# Patient Record
Sex: Female | Born: 1950 | Race: White | Hispanic: No | Smoking: Never smoker
Health system: Southern US, Community
[De-identification: ages and names within clinical notes are randomized; demographics above are authoritative.]

## PROBLEM LIST (undated history)

## (undated) DIAGNOSIS — K219 Gastro-esophageal reflux disease without esophagitis: Secondary | ICD-10-CM

## (undated) DIAGNOSIS — G4700 Insomnia, unspecified: Secondary | ICD-10-CM

## (undated) DIAGNOSIS — R5383 Other fatigue: Secondary | ICD-10-CM

## (undated) DIAGNOSIS — M549 Dorsalgia, unspecified: Secondary | ICD-10-CM

## (undated) DIAGNOSIS — K838 Other specified diseases of biliary tract: Secondary | ICD-10-CM

## (undated) DIAGNOSIS — K828 Other specified diseases of gallbladder: Secondary | ICD-10-CM

## (undated) DIAGNOSIS — D0430 Carcinoma in situ of skin of unspecified part of face: Secondary | ICD-10-CM

## (undated) HISTORY — DX: Insomnia, unspecified: G47.00

## (undated) HISTORY — DX: Carcinoma in situ of skin of unspecified part of face: D04.30

## (undated) HISTORY — DX: Other specified diseases of gallbladder: K82.8

## (undated) HISTORY — DX: Gastro-esophageal reflux disease without esophagitis: K21.9

## (undated) HISTORY — DX: Dorsalgia, unspecified: M54.9

## (undated) HISTORY — PX: OTHER SURGICAL HISTORY: SHX169

## (undated) HISTORY — DX: Other fatigue: R53.83

## (undated) HISTORY — DX: Other specified diseases of biliary tract: K83.8

---

## 1999-01-28 HISTORY — PX: ABDOMINAL HYSTERECTOMY: SHX81

## 2008-07-07 ENCOUNTER — Ambulatory Visit (HOSPITAL_COMMUNITY): Admission: RE | Admit: 2008-07-07 | Discharge: 2008-07-07 | Payer: Self-pay | Admitting: Gastroenterology

## 2009-01-27 LAB — HM COLONOSCOPY

## 2009-10-27 LAB — HM MAMMOGRAPHY

## 2010-10-17 ENCOUNTER — Encounter: Payer: Self-pay | Admitting: Family Medicine

## 2010-10-17 ENCOUNTER — Ambulatory Visit (INDEPENDENT_AMBULATORY_CARE_PROVIDER_SITE_OTHER): Payer: BC Managed Care – PPO | Admitting: Family Medicine

## 2010-10-17 VITALS — BP 141/90 | HR 81 | Temp 97.7°F | Ht 59.0 in | Wt 126.8 lb

## 2010-10-17 DIAGNOSIS — K219 Gastro-esophageal reflux disease without esophagitis: Secondary | ICD-10-CM

## 2010-10-17 DIAGNOSIS — D0439 Carcinoma in situ of skin of other parts of face: Secondary | ICD-10-CM

## 2010-10-17 DIAGNOSIS — G8929 Other chronic pain: Secondary | ICD-10-CM

## 2010-10-17 DIAGNOSIS — G4700 Insomnia, unspecified: Secondary | ICD-10-CM

## 2010-10-17 DIAGNOSIS — D0430 Carcinoma in situ of skin of unspecified part of face: Secondary | ICD-10-CM

## 2010-10-17 DIAGNOSIS — K838 Other specified diseases of biliary tract: Secondary | ICD-10-CM

## 2010-10-17 DIAGNOSIS — R5383 Other fatigue: Secondary | ICD-10-CM

## 2010-10-17 DIAGNOSIS — M549 Dorsalgia, unspecified: Secondary | ICD-10-CM

## 2010-10-17 DIAGNOSIS — Z23 Encounter for immunization: Secondary | ICD-10-CM

## 2010-10-17 DIAGNOSIS — R5381 Other malaise: Secondary | ICD-10-CM

## 2010-10-17 DIAGNOSIS — M545 Low back pain, unspecified: Secondary | ICD-10-CM | POA: Insufficient documentation

## 2010-10-17 HISTORY — DX: Other specified diseases of biliary tract: K83.8

## 2010-10-17 HISTORY — DX: Carcinoma in situ of skin of unspecified part of face: D04.30

## 2010-10-17 MED ORDER — CYCLOBENZAPRINE HCL 5 MG PO TABS: ORAL_TABLET | ORAL | Status: DC

## 2010-10-17 MED ORDER — ESOMEPRAZOLE MAGNESIUM 40 MG PO CPDR: DELAYED_RELEASE_CAPSULE | ORAL | Status: DC

## 2010-10-17 MED ORDER — RANITIDINE HCL 300 MG PO TABS: ORAL_TABLET | ORAL | Status: DC

## 2010-10-17 NOTE — Assessment & Plan Note (Signed)
Following with GI so far her symptoms are controlled with diet and Nexium so no further intervention has been pursued

## 2010-10-17 NOTE — Assessment & Plan Note (Signed)
Biopsy done in [place] and translated by family, they have been offered reassurance by dermatologist that all looks good but they agree to set her up with [facility] for further surveillance

## 2010-10-17 NOTE — Patient Instructions (Addendum)
Preventative Care for Adults - Female Studies show that half of deaths in the [place] today result from unhealthy lifestyle practices. This includes ignoring preventive care suggestions. Preventive health guidelines for women include the following key practices:  A routine yearly physical is a good way to check with your primary caregiver about your health and preventive screening. It is a chance to share any concerns and updates on your health, and to receive a thorough all-over exam.   If you smoke cigarettes, find out from your caregiver how to quit. It can literally save your life, no matter how long you have been a tobacco user. If you do not use tobacco, never start.   Maintain a healthy diet and normal weight. Increased weight leads to problems with blood pressure and diabetes. Decrease saturated fat in your diet and increase regular exercise. Eat a variety of foods, including fruit, vegetables, animal or vegetable protein (meat, fish, chicken, and eggs, or beans, lentils, and tofu), and grains, such as rice. Get information about proper diet from your caregiver, if needed.   Aerobic exercise helps maintain good heart health. The CDC and the American College of Sports Medicine recommend 30 minutes of moderate-intensity exercise (a brisk walk that increases your heart rate and breathing) on most days of the week. Ongoing high blood pressure should be treated with medicines, if weight loss and exercise are not effective.   Avoid smoking, drinking too much alcohol (more than two drinks per day), and use of street drugs. Do not share needles with anyone. Ask for professional help if you need support or instructions about stopping the use of alcohol, cigarettes, or drugs.   Maintain normal blood lipids and cholesterol, by minimizing your intake of saturated fat. Eat a well rounded diet, with plenty of fruit and vegetables. The National Institutes of Health encourage women to eat 5-9 servings of  fruit and vegetables each day. Your caregiver can give instructions to help you keep your risk of heart disease or stroke low. High blood pressure causes heart disease and increases risk of stroke. Blood pressure should be checked every 1-2 years, from age 20 onward.   Blood tests for high cholesterol, which causes heart and vessel disease, should begin at age 20 and be repeated every 5 years, if test results are normal. (Repeat tests more often if results are high.)   Diabetes screening involves taking a blood sample to check your blood sugar level, after a fasting period. This is done once every 3 years, after age 45, if test results are normal.   Breast cancer screening is essential to preventive care for women. All women age 20 and older should perform a breast self-exam every month. At age 40 and older, women should have their caregiver complete a breast exam each year. Women at ages 40-50 should have a mammogram (x-ray film) of the breasts each year. Your caregiver can discuss when to start your yearly mammograms.   Cervical cancer screening includes taking a Pap smear (sample of cells examined under a microscope) from the cervix (end of the uterus). It also includes testing for HPV (Human Papilloma Virus, which can cause cervical cancer). Screening and a pelvic exam should begin at age 21, or 3 years after a woman becomes sexually active. Screening should occur every year, with a Pap smear but no HPV testing, up to age 30. After age 30, you should have a Pap smear every 3 years with HPV testing, if no HPV was found previously.     Colon cancer can be detected, and often prevented, long before it is life threatening. Most routine colon cancer screening begins at the age of 50. On a yearly basis, your caregiver may provide easy-to-use take-home tests to check for hidden blood in the stool. Use of a small camera at the end of a tube, to directly examine the colon (sigmoidoscopy or colonoscopy), can  detect the earliest forms of colon cancer and can be life saving. Talk to your caregiver about this at age 50, when routine screening begins. (Screening is repeated every 5 years, unless early forms of pre-cancerous polyps or small growths are found.)   Practice safe sex. Use condoms. Condoms are used for birth control and to reduce the spread of sexually transmitted infections (STIs). Unsafe sex is sexual activity without the use of safeguards, such as condoms and avoidance of high-risk acts, to reduce the chances of getting or spreading STIs. STIs include gonorrhea (the clap), chlamydia, syphilis, trichimonas, herpes, HPV (human papilloma virus) and HIV (human immunodeficiency virus), which causes AIDS. Herpes, HIV, and HPV are viral illnesses that have no cure. They can result in disability, cancer, and death.   HPV causes cancer of the cervix, and other infections that can be transmitted from person to person. There is a vaccine for HPV, and females should get immunized between the ages of 11 and 26. It requires a series of 3 shots.   Osteoporosis is a disease in which the bones lose minerals and strength as we age. This can result in serious bone fractures. Risk of osteoporosis can be identified using a bone density scan. Women ages 65 and over should discuss this with their caregivers, as should women after menopause who have other risk factors. Ask your caregiver whether you should be taking a calcium supplement and Vitamin D, to reduce the rate of osteoporosis.   Menopause can be associated with physical symptoms and risks. Hormone replacement therapy is available to decrease these. You should talk to your caregiver about whether starting or continuing to take hormones is right for you.   Use sunscreen with SPF (skin protection factor) of 15 or more. Apply sunscreen liberally and repeatedly throughout the day. Being outside in the sun, when your shadow is shorter than you are, means you are being  exposed to sun at greater intensity. Lighter skinned people are at a greater risk of skin cancer. Wear sunglasses, to protect your eyes from too much damaging sunlight (which can speed up cataract formation).   Once a month, do a whole body skin exam or review, using a mirror to look at your back. Notify your caregiver of changes in moles, especially if there are changes in shapes, colors, irregular border, a size larger than a pencil eraser, or new moles develop.   Keep carbon monoxide and smoke detectors in your home, and functioning, at all times. Change the batteries every 6 months, or use a model that plugs into the wall.   Stay up to date with your tetanus shots and other required immunizations. A booster for tetanus should be given every 10 years. Be sure to get your flu shot every year, since 5%-20% of the [place] population comes down with the flu. The composition of the flu vaccine changes each year, so being vaccinated once is not enough. Get your shot in the fall, before the flu season peaks. The table below lists important vaccines to get. Other vaccines to consider include for Hepatitis A virus (to prevent a form of   infection of the liver, by a virus acquired from food), Varicella Zoster (a virus that causes shingles), and Meninogoccal (against bacteria which cause a form of meningitis).   Brush your teeth twice a day with fluoride toothpaste, and floss once a day. Good oral hygiene prevents tooth decay and gum disease, which can be painful and can cause other health problems. Visit your dentist for a routine oral and dental check up and preventive care every 6-12 months.   The Body Mass Index or BMI is a way of measuring how much of your body is fat. Having a BMI above 27 increases the risk of heart disease, diabetes, hypertension, stroke and other problems related to obesity. Your caregiver can help determine your BMI, and can develop an exercise and dietary program to help you achieve or  maintain this measurement at a healthy level.   Wear seat belts whenever you are in a vehicle, whether as passenger or driver, and even for short drives of a few minutes.   If you bicycle, wear a helmet at all times.  Preventative Care for Adult Women  Preventative Services Ages 40-39 Ages 12-64 Ages 55 and over  Health risk assessment and lifestyle counseling.     Blood pressure check.** Every 1-2 years Every 1-2 years Every 1-2 years  Total cholesterol check including HDL.** Every 5 years beginning at age 48 Every 5 years beginning at age 19, or more often if risk is high Every 5 years through age 79, then optional  Breast self exam. Monthly in all women ages 49 and older Monthly Monthly  Clinical breast exam.** Every 3 years beginning at age 85 Every year Every year  Mammogram.**  Every year beginning at age 89, optional from age 25-49 (discuss with your caregiver). Every year until age 45, then optional  Pap Smear** and HPV Screening. Every year from ages 32 through 28 Every 3 years from ages 61 through 52, if HPV is negative Optional; talk with your caregiver  Flexible sigmoidoscopy** or colonoscopy.**   Every 5 years beginning at age 66 Every 5 years until age 42; then optional  FOBT (fecal occult blood test) of stool.  Every year beginning at age 63 Every year until 65; then optional  Skin self-exam. Monthly Monthly Monthly  Tetanus-diphtheria (Td) immunization. Every 10 years Every 10 years Every 10 years  Influenza immunization.** Every year Every year Every year  HPV immunization. Once between the ages of 42 and 35     Pneumococcal immunization.** Optional Optional Every 5 years  Hepatitis B immunization.** Series of 3 immunizations  (if not done previously, usually given at 0, 1 to 2, and 4 to 6 months)  Check with your caregiver, if vaccination not previously given Check with your caregiver, if vaccination not previously given  ** Family history and personal history of risk and  conditions may change your caregiver's recommendations.  Document Released: 03/11/2001 Document Re-Released: 04/09/2009 [facility] Patient Information 2011 [place], [place].   Megared fish oil caps, 1 daily by Schiff  Consider Zostavax shot to prevent shingles  Find out if she got the Td or Tdap shot, if she only got the Td then she needs to boost with a Tdap to prevent pertussis/whooping cough

## 2010-10-17 NOTE — Assessment & Plan Note (Signed)
Poor sleep, multifactorial but low back pain is contributing encouraged to consider Melatonin, is given some low dose Cyclobenzaprine for low back pain as well

## 2010-10-17 NOTE — Assessment & Plan Note (Signed)
Patient with long history of low back pain and scoliosis is following with Chiropractic and getting some relief, encouraged moist heat and gentle stretching and given Cyclobenzaprine to take prn

## 2010-10-17 NOTE — Progress Notes (Signed)
Suzanne Ruiz [id number] 1950/10/10 10/17/2010      Progress Note New Patient  Subjective  Chief Complaint  Chief Complaint  Patient presents with  . Establish Care    new patient    HPI  Patient is a 60 yo Caucasian female who has moved here from [place] to help her daughter care for her 3 young children. She does not speak any English so her daughter is here to translate with her 3 young children so the visit is very chaotic. She denies any acute complaints except for a bruise on her nose x 1 day and some low back pain. She has trouble with recurrent low back pain and some left lower extremity radicular symptoms at times. Scoliosis has been a problem for her over the years and she is presently having a flare in her low back pain. No incontinence. No CP/palp/SOB/fevers/congestion/GI or GU c/o. Reflux is well controlled on Nexium daily but daughter is concerned about osteoporosis.  Past Medical History  Diagnosis Date  . Back pain   . Gallbladder sludge   . Insomnia   . GERD (gastroesophageal reflux disease)   . Fatigue   . Biliary sludge 10/17/2010  . 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 Abdominal hysterectomy 2001    still has ovaries  . Skin biopsy on nose     cancerous    Family History  Problem Relation Age of Onset  . Cancer Mother     duodenal   . Other Brother     paralyzed on one side- but can walk and work  . Cancer Maternal Grandfather   . Obesity Maternal Grandfather     History   Social History  . Marital Status: Widowed    Spouse Name: N/A    Number of Children: N/A  . Years of Education: N/A   Occupational History  . Not on file.   Social History Main Topics  . Smoking status: Never Smoker   . Smokeless tobacco: Never Used  . Alcohol Use: No  . Drug Use: No  . Sexually Active: Not on file   Other Topics Concern  . Not on file   Social History Narrative   From [place]- been in [place] since  2009    No current outpatient prescriptions on file prior to visit.    Allergies  Allergen Reactions  . Mucinex     Sick to stomach    Review of Systems  Review of Systems  Constitutional: Positive for malaise/fatigue. Negative for fever and chills.  HENT: Negative for hearing loss, nosebleeds and congestion.   Eyes: Negative for discharge.  Respiratory: Negative for cough, sputum production, shortness of breath and wheezing.   Cardiovascular: Negative for chest pain, palpitations and leg swelling.  Gastrointestinal: Positive for heartburn. Negative for nausea, vomiting, abdominal pain, diarrhea, constipation and blood in stool.  Genitourinary: Negative for dysuria, urgency, frequency and hematuria.  Musculoskeletal: Negative for myalgias, back pain and falls.  Skin: Negative for rash.  Neurological: Negative for dizziness, tremors, sensory change, focal weakness, loss of consciousness, weakness and headaches.  Endo/Heme/Allergies: Negative for polydipsia. Does not bruise/bleed easily.  Psychiatric/Behavioral: Negative for depression, suicidal ideas and substance abuse. The patient has insomnia. The patient is not nervous/anxious.     Objective  BP 141/90  Pulse 81  Temp(Src) 97.7 F (36.5 C) (Oral)  Ht 4\' 11"  (1.499 m)  Wt 126 lb 12.8 oz (57.516 kg)  BMI  25.61 kg/m2  SpO2 98%  Physical Exam  Physical Exam  Constitutional: She is oriented to person, place, and time and well-developed, well-nourished, and in no distress. No distress.  HENT:  Head: Normocephalic and atraumatic.  Right Ear: External ear normal.  Left Ear: External ear normal.  Nose: Nose normal.  Mouth/Throat: Oropharynx is clear and moist. No oropharyngeal exudate.  Eyes: Conjunctivae are normal. Pupils are equal, round, and reactive to light. Right eye exhibits no discharge. Left eye exhibits no discharge. No scleral icterus.  Neck: Normal range of motion. Neck supple. No thyromegaly present.    Cardiovascular: Normal rate, regular rhythm, normal heart sounds and intact distal pulses.   No murmur heard. Pulmonary/Chest: Effort normal and breath sounds normal. No respiratory distress. She has no wheezes. She has no rales.  Abdominal: Soft. Bowel sounds are normal. She exhibits no distension and no mass. There is no tenderness.  Musculoskeletal: Normal range of motion. She exhibits tenderness. She exhibits no edema.       Mild discomfort paravertebral muscles in low back pain  Lymphadenopathy:    She has no cervical adenopathy.  Neurological: She is alert and oriented to person, place, and time. She has normal reflexes. She displays normal reflexes. No cranial nerve deficit. Gait normal. Coordination normal.  Skin: Skin is warm and dry. No rash noted. She is not diaphoretic.  Psychiatric: Mood, memory and affect normal.       Assessment & Plan  Bowen's disease of face Biopsy done in [place] and translated by family, they have been offered reassurance by dermatologist that all looks good but they agree to set her up with [facility] for further surveillance  Biliary sludge Following with GI so far her symptoms are controlled with diet and Nexium so no further intervention has been pursued  GERD (gastroesophageal reflux disease) Avoid offending foods and may try altering Nexium with Ranitidine every other day reassess at next visit  Fatigue Multifactorial, difficult to assess with language barrier, will monitor over time, check a TSH with lab work, old labs from last spring in [place] unremarkable for any organ dysfunction, anemia, diabetes but her Thyroid was not checked  Insomnia Poor sleep, multifactorial but low back pain is contributing encouraged to consider Melatonin, is given some low dose Cyclobenzaprine for low back pain as well  Chronic low back pain Patient with long history of low back pain and scoliosis is following with Chiropractic and getting some  relief, encouraged moist heat and gentle stretching and given Cyclobenzaprine to take prn

## 2010-10-17 NOTE — Assessment & Plan Note (Signed)
Avoid offending foods and may try altering Nexium with Ranitidine every other day reassess at next visit

## 2010-10-17 NOTE — Assessment & Plan Note (Signed)
Multifactorial, difficult to assess with language barrier, will monitor over time, check a TSH with lab work, old labs from last spring in [place] unremarkable for any organ dysfunction, anemia, diabetes but her Thyroid was not checked

## 2010-12-09 ENCOUNTER — Other Ambulatory Visit (INDEPENDENT_AMBULATORY_CARE_PROVIDER_SITE_OTHER): Payer: BC Managed Care – PPO

## 2010-12-09 DIAGNOSIS — Z0000 Encounter for general adult medical examination without abnormal findings: Secondary | ICD-10-CM

## 2010-12-09 DIAGNOSIS — G8929 Other chronic pain: Secondary | ICD-10-CM

## 2010-12-09 DIAGNOSIS — M545 Low back pain: Secondary | ICD-10-CM

## 2010-12-09 DIAGNOSIS — K219 Gastro-esophageal reflux disease without esophagitis: Secondary | ICD-10-CM

## 2010-12-09 LAB — CBC
MCV: 86 fL (ref 78.0–100.0)
Platelets: 210 10*3/uL (ref 150–400)
RBC: 4.7 MIL/uL (ref 3.87–5.11)
RDW: 12.6 % (ref 11.5–15.5)
WBC: 7.5 10*3/uL (ref 4.0–10.5)

## 2010-12-10 LAB — HEPATIC FUNCTION PANEL
ALT: 25 U/L (ref 0–35)
AST: 29 U/L (ref 0–37)
Alkaline Phosphatase: 57 U/L (ref 39–117)
Bilirubin, Direct: 0.1 mg/dL (ref 0.0–0.3)
Total Bilirubin: 0.9 mg/dL (ref 0.3–1.2)

## 2010-12-10 LAB — RENAL FUNCTION PANEL
Albumin: 4.1 g/dL (ref 3.5–5.2)
Chloride: 105 mEq/L (ref 96–112)
GFR: 91.87 mL/min (ref >60.00)
Glucose, Bld: 75 mg/dL (ref 70–99)
Phosphorus: 4.1 mg/dL (ref 2.3–4.6)
Potassium: 4.4 mEq/L (ref 3.5–5.1)
Sodium: 141 mEq/L (ref 135–145)

## 2010-12-10 LAB — LIPID PANEL
Cholesterol: 167 mg/dL (ref 0–200)
LDL Cholesterol: 105 mg/dL — ABNORMAL HIGH (ref 0–99)
Total CHOL/HDL Ratio: 4
VLDL: 14.4 mg/dL (ref 0.0–40.0)

## 2010-12-10 LAB — SEDIMENTATION RATE: Sed Rate: 16 mm/hr (ref 0–22)

## 2010-12-18 ENCOUNTER — Ambulatory Visit (INDEPENDENT_AMBULATORY_CARE_PROVIDER_SITE_OTHER): Payer: BC Managed Care – PPO | Admitting: Family Medicine

## 2010-12-18 ENCOUNTER — Encounter: Payer: Self-pay | Admitting: Family Medicine

## 2010-12-18 VITALS — BP 127/81 | HR 74 | Temp 97.8°F | Ht 59.0 in | Wt 127.8 lb

## 2010-12-18 DIAGNOSIS — M545 Low back pain: Secondary | ICD-10-CM

## 2010-12-18 DIAGNOSIS — G8929 Other chronic pain: Secondary | ICD-10-CM

## 2010-12-18 DIAGNOSIS — R5383 Other fatigue: Secondary | ICD-10-CM

## 2010-12-18 DIAGNOSIS — K219 Gastro-esophageal reflux disease without esophagitis: Secondary | ICD-10-CM

## 2010-12-18 MED ORDER — ESOMEPRAZOLE MAGNESIUM 40 MG PO CPDR: 40.0000 mg | DELAYED_RELEASE_CAPSULE | Freq: Every day | ORAL | Status: AC

## 2010-12-18 MED ORDER — ESOMEPRAZOLE MAGNESIUM 40 MG PO CPDR: DELAYED_RELEASE_CAPSULE | ORAL | Status: DC

## 2010-12-18 NOTE — Patient Instructions (Signed)

## 2010-12-30 NOTE — Assessment & Plan Note (Signed)
CBC, renal and TSH all normal. Encouraged 8 hour sleep, regular exercise, heart healthy diet

## 2010-12-30 NOTE — Progress Notes (Signed)
Suzanne Ruiz [id number] [birth year] 12/30/2010      Progress Note-Follow Up  Subjective  Chief Complaint  Chief Complaint  Patient presents with  . Follow-up    2 month follow up    HPI  Patient is a 60 year old Caucasian female who is in today for followup. She continues to have heartburn did try ranitidine alternating with Nexium but did not get adequate relief so she has maintained her Nexium with better results. Does still occasionally have breakthrough dyspepsia. No acute illness, fevers, congestion, chest pain, palpitations, shortness of breath, GI or GU complaints does continually stroke with fatigue but it is not worsening. Intermittent low back pain occurs she does take Flexeril with good results but she needs it but not frequently. This does help with sleep somewhat.  Past Medical History  Diagnosis Date  . Back pain   . Gallbladder sludge   . Insomnia   . GERD (gastroesophageal reflux disease)   . Fatigue   . Biliary sludge 10/17/2010  . 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 Abdominal hysterectomy 2001    still has ovaries  . Skin biopsy on nose     cancerous    Family History  Problem Relation Age of Onset  . Cancer Mother     duodenal   . Other Brother     paralyzed on one side- but can walk and work  . Cancer Maternal Grandfather   . Obesity Maternal Grandfather     History   Social History  . Marital Status: Widowed    Spouse Name: N/A    Number of Children: N/A  . Years of Education: N/A   Occupational History  . Not on file.   Social History Main Topics  . Smoking status: Never Smoker   . Smokeless tobacco: Never Used  . Alcohol Use: No  . Drug Use: No  . Sexually Active: Not on file   Other Topics Concern  . Not on file   Social History Narrative   From [place]- been in [place] since 2009    Current Outpatient Prescriptions on File Prior to Visit  Medication Sig Dispense Refill  .  Multiple Vitamin (MULTIVITAMIN) tablet Take 1 tablet by mouth daily.          Allergies  Allergen Reactions  . Mucinex     Sick to stomach    Review of Systems  Review of Systems  Constitutional: Negative for fever.  HENT: Negative for congestion.   Eyes: Negative for discharge.  Respiratory: Negative for shortness of breath.   Cardiovascular: Negative for chest pain, palpitations and leg swelling.  Gastrointestinal: Positive for heartburn. Negative for nausea, abdominal pain and diarrhea.  Genitourinary: Negative for dysuria.  Musculoskeletal: Positive for back pain. Negative for falls.  Skin: Negative for rash.  Neurological: Negative for loss of consciousness and headaches.  Endo/Heme/Allergies: Negative for polydipsia.  Psychiatric/Behavioral: Negative for depression and suicidal ideas. The patient is not nervous/anxious and does not have insomnia.     Objective  BP 127/81  Pulse 74  Temp(Src) 97.8 F (36.6 C) (Oral)  Ht 4\' 11"  (1.499 m)  Wt 127 lb 12.8 oz (57.97 kg)  BMI 25.81 kg/m2  SpO2 98%  Physical Exam  Physical Exam  Constitutional: She is oriented to person, place, and time and well-developed, well-nourished, and in no distress. No distress.  HENT:  Head: Normocephalic and atraumatic.  Eyes: Conjunctivae are normal.  Neck: Neck  supple. No thyromegaly present.  Cardiovascular: Normal rate, regular rhythm and normal heart sounds.   No murmur heard. Pulmonary/Chest: Effort normal and breath sounds normal. She has no wheezes.  Abdominal: She exhibits no distension and no mass.  Musculoskeletal: She exhibits no edema.  Lymphadenopathy:    She has no cervical adenopathy.  Neurological: She is alert and oriented to person, place, and time.  Skin: Skin is warm and dry. No rash noted. She is not diaphoretic.  Psychiatric: Memory, affect and judgment normal.    Lab Results  Component Value Date   TSH 0.92 12/09/2010   Lab Results  Component Value Date     WBC 7.5 12/09/2010   HGB 13.3 12/09/2010   HCT 40.4 12/09/2010   MCV 86.0 12/09/2010   PLT 210 12/09/2010   Lab Results  Component Value Date   CREATININE 0.7 12/09/2010   BUN 17 12/09/2010   NA 141 12/09/2010   K 4.4 12/09/2010   CL 105 12/09/2010   CO2 28 12/09/2010   Lab Results  Component Value Date   ALT 25 12/09/2010   AST 29 12/09/2010   ALKPHOS 57 12/09/2010   BILITOT 0.9 12/09/2010   Lab Results  Component Value Date   CHOL 167 12/09/2010   Lab Results  Component Value Date   HDL 47.30 12/09/2010   Lab Results  Component Value Date   LDLCALC 105* 12/09/2010   Lab Results  Component Value Date   TRIG 72.0 12/09/2010   Lab Results  Component Value Date   CHOLHDL 4 12/09/2010     Assessment & Plan  Chronic low back pain No new c/o today. Continue low dose meds only as needed and remain phsycially active as tolerated  GERD (gastroesophageal reflux disease) Symptoms persist, encouraged to avoid offending foods, did not get adequate relief with Ranitidine so encouraged to cont Nexium daily and use Ranitidine and Tums prn  Fatigue CBC, renal and TSH all normal. Encouraged 8 hour sleep, regular exercise, heart healthy diet

## 2010-12-30 NOTE — Assessment & Plan Note (Signed)
No new c/o today. Continue low dose meds only as needed and remain phsycially active as tolerated

## 2010-12-30 NOTE — Assessment & Plan Note (Signed)
Symptoms persist, encouraged to avoid offending foods, did not get adequate relief with Ranitidine so encouraged to cont Nexium daily and use Ranitidine and Tums prn

## 2011-12-18 ENCOUNTER — Telehealth: Payer: Self-pay

## 2011-12-18 NOTE — Telephone Encounter (Signed)
Per Dr Milinda Cave as long as 90 month old was vaccinated with Varicella at 12 months everything would be ok

## 2011-12-18 NOTE — Telephone Encounter (Signed)
Patient lives with and cares for 29 month old boy - was advised last year that the shingles vaccination was not recommended because of the infant in the household.  Would like to know when at what age should the baby to be safe for the Patient to get vaccinated? Please contact Patient's daughter Domingo Mend at [phone].

## 2011-12-18 NOTE — Telephone Encounter (Signed)
Patients daughter informed and stated she would check into her sons vaccines.

## 2011-12-19 NOTE — Telephone Encounter (Signed)
Thanks, I agree

## 2014-02-27 ENCOUNTER — Other Ambulatory Visit: Payer: Self-pay | Admitting: Obstetrics and Gynecology

## 2014-02-27 DIAGNOSIS — R928 Other abnormal and inconclusive findings on diagnostic imaging of breast: Secondary | ICD-10-CM

## 2014-03-09 ENCOUNTER — Other Ambulatory Visit: Payer: Self-pay

## 2014-03-09 ENCOUNTER — Ambulatory Visit
Admission: RE | Admit: 2014-03-09 | Discharge: 2014-03-09 | Disposition: A | Discharge status: Home / Self Care | Payer: BLUE CROSS/BLUE SHIELD | Source: Ambulatory Visit | Attending: Obstetrics and Gynecology | Admitting: Obstetrics and Gynecology

## 2014-03-09 ENCOUNTER — Other Ambulatory Visit: Payer: Self-pay | Admitting: Obstetrics and Gynecology

## 2014-03-09 DIAGNOSIS — R928 Other abnormal and inconclusive findings on diagnostic imaging of breast: Secondary | ICD-10-CM

## 2014-03-23 ENCOUNTER — Other Ambulatory Visit: Payer: BLUE CROSS/BLUE SHIELD

## 2014-03-24 ENCOUNTER — Other Ambulatory Visit: Payer: Self-pay | Admitting: Obstetrics and Gynecology

## 2014-03-24 DIAGNOSIS — R928 Other abnormal and inconclusive findings on diagnostic imaging of breast: Secondary | ICD-10-CM

## 2014-04-05 ENCOUNTER — Inpatient Hospital Stay: Admission: RE | Admit: 2014-04-05 | Payer: BLUE CROSS/BLUE SHIELD | Source: Ambulatory Visit

## 2014-04-06 ENCOUNTER — Ambulatory Visit
Admission: RE | Admit: 2014-04-06 | Discharge: 2014-04-06 | Disposition: A | Discharge status: Home / Self Care | Payer: BLUE CROSS/BLUE SHIELD | Source: Ambulatory Visit | Attending: Obstetrics and Gynecology | Admitting: Obstetrics and Gynecology

## 2014-04-06 DIAGNOSIS — R928 Other abnormal and inconclusive findings on diagnostic imaging of breast: Secondary | ICD-10-CM

## 2014-04-06 MED ORDER — GADOBENATE DIMEGLUMINE 529 MG/ML IV SOLN
13.0000 mL | Freq: Once | INTRAVENOUS | Status: AC | PRN
  Administered 2014-04-06: 13 mL via INTRAVENOUS

## 2014-04-07 ENCOUNTER — Other Ambulatory Visit: Payer: Self-pay | Admitting: Obstetrics and Gynecology

## 2014-04-07 DIAGNOSIS — R928 Other abnormal and inconclusive findings on diagnostic imaging of breast: Secondary | ICD-10-CM

## 2014-04-08 ENCOUNTER — Ambulatory Visit
Admission: RE | Admit: 2014-04-08 | Discharge: 2014-04-08 | Disposition: A | Discharge status: Home / Self Care | Payer: BLUE CROSS/BLUE SHIELD | Source: Ambulatory Visit | Attending: Obstetrics and Gynecology | Admitting: Obstetrics and Gynecology

## 2014-04-08 DIAGNOSIS — R928 Other abnormal and inconclusive findings on diagnostic imaging of breast: Secondary | ICD-10-CM

## 2014-04-10 ENCOUNTER — Other Ambulatory Visit: Payer: BLUE CROSS/BLUE SHIELD

## 2014-04-10 ENCOUNTER — Other Ambulatory Visit: Payer: Self-pay | Admitting: Obstetrics and Gynecology

## 2014-04-10 DIAGNOSIS — R928 Other abnormal and inconclusive findings on diagnostic imaging of breast: Secondary | ICD-10-CM

## 2014-04-11 ENCOUNTER — Inpatient Hospital Stay: Admission: RE | Admit: 2014-04-11 | Payer: BLUE CROSS/BLUE SHIELD | Source: Ambulatory Visit

## 2014-04-12 ENCOUNTER — Other Ambulatory Visit: Payer: BLUE CROSS/BLUE SHIELD

## 2014-04-17 ENCOUNTER — Ambulatory Visit
Admission: RE | Admit: 2014-04-17 | Discharge: 2014-04-17 | Disposition: A | Discharge status: Home / Self Care | Payer: BLUE CROSS/BLUE SHIELD | Source: Ambulatory Visit | Attending: Obstetrics and Gynecology | Admitting: Obstetrics and Gynecology

## 2014-04-17 DIAGNOSIS — R928 Other abnormal and inconclusive findings on diagnostic imaging of breast: Secondary | ICD-10-CM

## 2014-04-17 MED ORDER — GADOBENATE DIMEGLUMINE 529 MG/ML IV SOLN
13.0000 mL | Freq: Once | INTRAVENOUS | Status: AC | PRN
  Administered 2014-04-17: 13 mL via INTRAVENOUS

## 2014-06-20 ENCOUNTER — Other Ambulatory Visit: Payer: Self-pay | Admitting: General Surgery

## 2014-06-20 DIAGNOSIS — N6489 Other specified disorders of breast: Secondary | ICD-10-CM

## 2014-06-27 ENCOUNTER — Ambulatory Visit
Admission: RE | Admit: 2014-06-27 | Discharge: 2014-06-27 | Disposition: A | Discharge status: Home / Self Care | Payer: BLUE CROSS/BLUE SHIELD | Source: Ambulatory Visit | Attending: General Surgery | Admitting: General Surgery

## 2014-06-27 DIAGNOSIS — N6489 Other specified disorders of breast: Secondary | ICD-10-CM

## 2014-07-07 ENCOUNTER — Other Ambulatory Visit: Payer: Self-pay | Admitting: General Surgery

## 2014-07-07 DIAGNOSIS — N6489 Other specified disorders of breast: Secondary | ICD-10-CM | POA: Insufficient documentation

## 2014-07-20 ENCOUNTER — Encounter (HOSPITAL_BASED_OUTPATIENT_CLINIC_OR_DEPARTMENT_OTHER)
Admission: RE | Admit: 2014-07-20 | Discharge: 2014-07-20 | Disposition: A | Discharge status: Home / Self Care | Payer: BLUE CROSS/BLUE SHIELD | Source: Ambulatory Visit | Attending: General Surgery | Admitting: General Surgery

## 2014-07-20 ENCOUNTER — Ambulatory Visit
Admission: RE | Admit: 2014-07-20 | Discharge: 2014-07-20 | Disposition: A | Discharge status: Home / Self Care | Payer: BLUE CROSS/BLUE SHIELD | Source: Ambulatory Visit | Attending: General Surgery | Admitting: General Surgery

## 2014-07-20 ENCOUNTER — Encounter (HOSPITAL_BASED_OUTPATIENT_CLINIC_OR_DEPARTMENT_OTHER): Payer: Self-pay | Admitting: *Deleted

## 2014-07-20 DIAGNOSIS — N6489 Other specified disorders of breast: Secondary | ICD-10-CM

## 2014-07-20 DIAGNOSIS — K219 Gastro-esophageal reflux disease without esophagitis: Secondary | ICD-10-CM | POA: Diagnosis not present

## 2014-07-20 DIAGNOSIS — Z880 Allergy status to penicillin: Secondary | ICD-10-CM | POA: Diagnosis not present

## 2014-07-20 DIAGNOSIS — Z90710 Acquired absence of both cervix and uterus: Secondary | ICD-10-CM | POA: Diagnosis not present

## 2014-07-20 DIAGNOSIS — N6022 Fibroadenosis of left breast: Secondary | ICD-10-CM | POA: Diagnosis not present

## 2014-07-20 DIAGNOSIS — Z888 Allergy status to other drugs, medicaments and biological substances status: Secondary | ICD-10-CM | POA: Diagnosis not present

## 2014-07-20 LAB — CBC WITH DIFFERENTIAL/PLATELET
Basophils Absolute: 0 10*3/uL (ref 0.0–0.1)
Basophils Relative: 0 % (ref 0–1)
Eosinophils Absolute: 0.1 10*3/uL (ref 0.0–0.7)
Eosinophils Relative: 1 % (ref 0–5)
HCT: 39.1 % (ref 36.0–46.0)
Hemoglobin: 12.9 g/dL (ref 12.0–15.0)
LYMPHS ABS: 2.3 10*3/uL (ref 0.7–4.0)
LYMPHS PCT: 27 % (ref 12–46)
MCH: 27.7 pg (ref 26.0–34.0)
MCHC: 33 g/dL (ref 30.0–36.0)
MCV: 84.1 fL (ref 78.0–100.0)
MONO ABS: 0.5 10*3/uL (ref 0.1–1.0)
Monocytes Relative: 6 % (ref 3–12)
Neutro Abs: 5.5 10*3/uL (ref 1.7–7.7)
Neutrophils Relative %: 66 % (ref 43–77)
PLATELETS: 188 10*3/uL (ref 150–400)
RBC: 4.65 MIL/uL (ref 3.87–5.11)
RDW: 12.9 % (ref 11.5–15.5)
WBC: 8.4 10*3/uL (ref 4.0–10.5)

## 2014-07-20 LAB — BASIC METABOLIC PANEL
ANION GAP: 7 (ref 5–15)
BUN: 19 mg/dL (ref 6–20)
CHLORIDE: 105 mmol/L (ref 101–111)
CO2: 28 mmol/L (ref 22–32)
Calcium: 9 mg/dL (ref 8.9–10.3)
Creatinine, Ser: 0.73 mg/dL (ref 0.44–1.00)
GFR calc Af Amer: >60 mL/min (ref >60)
GFR calc non Af Amer: >60 mL/min (ref >60)
Glucose, Bld: 118 mg/dL — ABNORMAL HIGH (ref 65–99)
Potassium: 3.9 mmol/L (ref 3.5–5.1)
Sodium: 140 mmol/L (ref 135–145)

## 2014-07-24 ENCOUNTER — Encounter (HOSPITAL_BASED_OUTPATIENT_CLINIC_OR_DEPARTMENT_OTHER): Payer: Self-pay | Admitting: *Deleted

## 2014-07-24 ENCOUNTER — Ambulatory Visit (HOSPITAL_BASED_OUTPATIENT_CLINIC_OR_DEPARTMENT_OTHER)
Admission: RE | Admit: 2014-07-24 | Discharge: 2014-07-24 | Disposition: A | Discharge status: Home / Self Care | Payer: BLUE CROSS/BLUE SHIELD | Source: Ambulatory Visit | Attending: General Surgery | Admitting: General Surgery

## 2014-07-24 ENCOUNTER — Ambulatory Visit
Admission: RE | Admit: 2014-07-24 | Discharge: 2014-07-24 | Disposition: A | Discharge status: Home / Self Care | Payer: BLUE CROSS/BLUE SHIELD | Source: Ambulatory Visit | Attending: General Surgery | Admitting: General Surgery

## 2014-07-24 ENCOUNTER — Ambulatory Visit (HOSPITAL_BASED_OUTPATIENT_CLINIC_OR_DEPARTMENT_OTHER): Payer: BLUE CROSS/BLUE SHIELD | Admitting: Certified Registered"

## 2014-07-24 ENCOUNTER — Encounter (HOSPITAL_BASED_OUTPATIENT_CLINIC_OR_DEPARTMENT_OTHER)
Admission: RE | Disposition: A | Discharge status: Home / Self Care | Payer: Self-pay | Source: Ambulatory Visit | Attending: General Surgery

## 2014-07-24 DIAGNOSIS — Z880 Allergy status to penicillin: Secondary | ICD-10-CM | POA: Insufficient documentation

## 2014-07-24 DIAGNOSIS — N6022 Fibroadenosis of left breast: Secondary | ICD-10-CM | POA: Insufficient documentation

## 2014-07-24 DIAGNOSIS — N6489 Other specified disorders of breast: Secondary | ICD-10-CM

## 2014-07-24 DIAGNOSIS — Z888 Allergy status to other drugs, medicaments and biological substances status: Secondary | ICD-10-CM | POA: Insufficient documentation

## 2014-07-24 DIAGNOSIS — K219 Gastro-esophageal reflux disease without esophagitis: Secondary | ICD-10-CM | POA: Insufficient documentation

## 2014-07-24 DIAGNOSIS — Z90710 Acquired absence of both cervix and uterus: Secondary | ICD-10-CM | POA: Insufficient documentation

## 2014-07-24 HISTORY — PX: BREAST LUMPECTOMY WITH RADIOACTIVE SEED LOCALIZATION: SHX6424

## 2014-07-24 LAB — POCT HEMOGLOBIN-HEMACUE: HEMOGLOBIN: 13.5 g/dL (ref 12.0–15.0)

## 2014-07-24 SURGERY — BREAST LUMPECTOMY WITH RADIOACTIVE SEED LOCALIZATION
Anesthesia: General | Site: Breast | Laterality: Left

## 2014-07-24 MED ORDER — MIDAZOLAM HCL 2 MG/2ML IJ SOLN: 1.0000 mg | INTRAMUSCULAR | Status: DC | PRN

## 2014-07-24 MED ORDER — HYDROMORPHONE HCL 1 MG/ML IJ SOLN
0.2500 mg | INTRAMUSCULAR | Status: DC | PRN
  Administered 2014-07-24: 0.5 mg via INTRAVENOUS

## 2014-07-24 MED ORDER — FENTANYL CITRATE (PF) 100 MCG/2ML IJ SOLN
50.0000 ug | INTRAMUSCULAR | Status: DC | PRN
  Administered 2014-07-24: 50 ug via INTRAVENOUS

## 2014-07-24 MED ORDER — CEFAZOLIN SODIUM-DEXTROSE 2-3 GM-% IV SOLR: 2.0000 g | INTRAVENOUS | Status: DC

## 2014-07-24 MED ORDER — EPHEDRINE SULFATE 50 MG/ML IJ SOLN
INTRAMUSCULAR | Status: DC | PRN
  Administered 2014-07-24: 10 mg via INTRAVENOUS

## 2014-07-24 MED ORDER — HYDROMORPHONE HCL 1 MG/ML IJ SOLN
INTRAMUSCULAR | Status: AC
  Filled 2014-07-24: qty 1

## 2014-07-24 MED ORDER — PROPOFOL 10 MG/ML IV BOLUS
INTRAVENOUS | Status: DC | PRN
  Administered 2014-07-24: 150 mg via INTRAVENOUS

## 2014-07-24 MED ORDER — BUPIVACAINE HCL (PF) 0.25 % IJ SOLN
INTRAMUSCULAR | Status: DC | PRN
Start: 2014-07-24 — End: 2014-07-24
  Administered 2014-07-24: 10 mL

## 2014-07-24 MED ORDER — DEXAMETHASONE SODIUM PHOSPHATE 4 MG/ML IJ SOLN
INTRAMUSCULAR | Status: DC | PRN
  Administered 2014-07-24: 10 mg via INTRAVENOUS

## 2014-07-24 MED ORDER — SCOPOLAMINE 1 MG/3DAYS TD PT72: 1.0000 | MEDICATED_PATCH | Freq: Once | TRANSDERMAL | Status: DC | PRN

## 2014-07-24 MED ORDER — PROMETHAZINE HCL 25 MG/ML IJ SOLN
6.2500 mg | INTRAMUSCULAR | Status: DC | PRN
Start: 2014-07-24 — End: 2014-07-24

## 2014-07-24 MED ORDER — ONDANSETRON HCL 4 MG/2ML IJ SOLN
INTRAMUSCULAR | Status: DC | PRN
  Administered 2014-07-24: 4 mg via INTRAVENOUS

## 2014-07-24 MED ORDER — FENTANYL CITRATE (PF) 100 MCG/2ML IJ SOLN
INTRAMUSCULAR | Status: AC
  Filled 2014-07-24: qty 6

## 2014-07-24 MED ORDER — HYDROCODONE-ACETAMINOPHEN 10-325 MG PO TABS: 1.0000 | ORAL_TABLET | Freq: Four times a day (QID) | ORAL | Status: AC | PRN

## 2014-07-24 MED ORDER — LACTATED RINGERS IV SOLN
INTRAVENOUS | Status: DC
  Administered 2014-07-24 (×2): via INTRAVENOUS

## 2014-07-24 MED ORDER — LIDOCAINE HCL (CARDIAC) 20 MG/ML IV SOLN
INTRAVENOUS | Status: DC | PRN
  Administered 2014-07-24: 60 mg via INTRAVENOUS

## 2014-07-24 MED ORDER — GLYCOPYRROLATE 0.2 MG/ML IJ SOLN: 0.2000 mg | Freq: Once | INTRAMUSCULAR | Status: DC | PRN

## 2014-07-24 SURGICAL SUPPLY — 55 items
APPLIER CLIP 9.375 MED OPEN (MISCELLANEOUS)
BENZOIN TINCTURE PRP APPL 2/3 (GAUZE/BANDAGES/DRESSINGS) IMPLANT
BINDER BREAST LRG (GAUZE/BANDAGES/DRESSINGS) ×3 IMPLANT
BINDER BREAST MEDIUM (GAUZE/BANDAGES/DRESSINGS) IMPLANT
BINDER BREAST XLRG (GAUZE/BANDAGES/DRESSINGS) IMPLANT
BINDER BREAST XXLRG (GAUZE/BANDAGES/DRESSINGS) IMPLANT
BLADE SURG 15 STRL LF DISP TIS (BLADE) ×1 IMPLANT
BLADE SURG 15 STRL SS (BLADE) ×2
CANISTER SUC SOCK COL 7IN (MISCELLANEOUS) IMPLANT
CANISTER SUCT 1200ML W/VALVE (MISCELLANEOUS) ×3 IMPLANT
CHLORAPREP W/TINT 26ML (MISCELLANEOUS) ×3 IMPLANT
CLIP APPLIE 9.375 MED OPEN (MISCELLANEOUS) IMPLANT
CLOSURE WOUND 1/2 X4 (GAUZE/BANDAGES/DRESSINGS) ×1
COVER BACK TABLE 60X90IN (DRAPES) ×3 IMPLANT
COVER MAYO STAND STRL (DRAPES) ×3 IMPLANT
COVER PROBE W GEL 5X96 (DRAPES) ×3 IMPLANT
DECANTER SPIKE VIAL GLASS SM (MISCELLANEOUS) IMPLANT
DEVICE DUBIN W/COMP PLATE 8390 (MISCELLANEOUS) ×3 IMPLANT
DRAPE LAPAROSCOPIC ABDOMINAL (DRAPES) ×3 IMPLANT
DRSG TEGADERM 4X4.75 (GAUZE/BANDAGES/DRESSINGS) IMPLANT
ELECT COATED BLADE 2.86 ST (ELECTRODE) ×3 IMPLANT
ELECT REM PT RETURN 9FT ADLT (ELECTROSURGICAL) ×3
ELECTRODE REM PT RTRN 9FT ADLT (ELECTROSURGICAL) ×1 IMPLANT
GLOVE BIO SURGEON STRL SZ7 (GLOVE) ×9 IMPLANT
GLOVE BIOGEL PI IND STRL 7.5 (GLOVE) ×1 IMPLANT
GLOVE BIOGEL PI INDICATOR 7.5 (GLOVE) ×2
GLOVE EXAM NITRILE EXT CUFF MD (GLOVE) ×3 IMPLANT
GOWN STRL REUS W/ TWL LRG LVL3 (GOWN DISPOSABLE) ×2 IMPLANT
GOWN STRL REUS W/TWL LRG LVL3 (GOWN DISPOSABLE) ×4
KIT MARKER MARGIN INK (KITS) ×3 IMPLANT
LIQUID BAND (GAUZE/BANDAGES/DRESSINGS) ×3 IMPLANT
MARKER SKIN DUAL TIP RULER LAB (MISCELLANEOUS) ×3 IMPLANT
NEEDLE HYPO 25X1 1.5 SAFETY (NEEDLE) ×3 IMPLANT
NS IRRIG 1000ML POUR BTL (IV SOLUTION) IMPLANT
PACK BASIN DAY SURGERY FS (CUSTOM PROCEDURE TRAY) ×3 IMPLANT
PENCIL BUTTON HOLSTER BLD 10FT (ELECTRODE) ×3 IMPLANT
SLEEVE SCD COMPRESS KNEE MED (MISCELLANEOUS) ×3 IMPLANT
SPONGE GAUZE 4X4 12PLY STER LF (GAUZE/BANDAGES/DRESSINGS) IMPLANT
SPONGE LAP 4X18 X RAY DECT (DISPOSABLE) ×3 IMPLANT
STAPLER VISISTAT 35W (STAPLE) IMPLANT
STRIP CLOSURE SKIN 1/2X4 (GAUZE/BANDAGES/DRESSINGS) ×2 IMPLANT
SUT MNCRL AB 4-0 PS2 18 (SUTURE) ×3 IMPLANT
SUT MON AB 5-0 PS2 18 (SUTURE) IMPLANT
SUT SILK 2 0 SH (SUTURE) IMPLANT
SUT VIC AB 2-0 SH 27 (SUTURE) ×2
SUT VIC AB 2-0 SH 27XBRD (SUTURE) ×1 IMPLANT
SUT VIC AB 3-0 SH 27 (SUTURE) ×2
SUT VIC AB 3-0 SH 27X BRD (SUTURE) ×1 IMPLANT
SUT VIC AB 5-0 PS2 18 (SUTURE) IMPLANT
SYR CONTROL 10ML LL (SYRINGE) ×3 IMPLANT
TOWEL OR 17X24 6PK STRL BLUE (TOWEL DISPOSABLE) ×3 IMPLANT
TOWEL OR NON WOVEN STRL DISP B (DISPOSABLE) IMPLANT
TUBE CONNECTING 20'X1/4 (TUBING) ×1
TUBE CONNECTING 20X1/4 (TUBING) ×2 IMPLANT
YANKAUER SUCT BULB TIP NO VENT (SUCTIONS) ×3 IMPLANT

## 2014-07-24 NOTE — Transfer of Care (Signed)
Immediate Anesthesia Transfer of Care Note  Patient: Suzanne Ruiz  Procedure(s) Performed: Procedure(s): BREAST LUMPECTOMY WITH RADIOACTIVE SEED LOCALIZATION (Left)  Patient Location: PACU  Anesthesia Type:General  Level of Consciousness: awake and patient cooperative  Airway & Oxygen Therapy: Patient Spontanous Breathing and Patient connected to face mask oxygen  Post-op Assessment: Report given to RN and Post -op Vital signs reviewed and stable  Post vital signs: Reviewed and stable  Last Vitals:  Filed Vitals:   07/24/14 0949  BP: 170/85  Pulse: 79  Temp: 36.8 C  Resp: 20    Complications: No apparent anesthesia complications

## 2014-07-24 NOTE — Anesthesia Postprocedure Evaluation (Signed)
  Anesthesia Post-op Note  Patient: Suzanne Ruiz  Procedure(s) Performed: Procedure(s): BREAST LUMPECTOMY WITH RADIOACTIVE SEED LOCALIZATION (Left)  Patient Location: PACU  Anesthesia Type:General  Level of Consciousness: awake  Airway and Oxygen Therapy: Patient Spontanous Breathing  Post-op Pain: mild  Post-op Assessment: Post-op Vital signs reviewed              Post-op Vital Signs: Reviewed  Last Vitals:  Filed Vitals:   07/24/14 1210  BP: 153/82  Pulse: 85  Temp: 36.5 C  Resp: 16    Complications: No apparent anesthesia complications

## 2014-07-24 NOTE — H&P (Signed)
  64 yof who I saw with a left breast mm finding that has been further evaluated with mri. She ends up with an mri finding that was biopsied also and concordant initially. This apparently was dictated wrong and I was called later to say this was discordant. The mr biopsy is benign and does not need anything further. The mm lesion has now been biopsied and is a CSL. she returns today with her daughter who is translating. no changes since last visit   Problem List/Past Medical Rolm Bookbinder, MD; 07/07/2014 9:49 AM) ABNORMAL MAMMOGRAM (793.80  R92.8) core biopsy of mammographic lesion and return if needed. I spent about 30 minutes discussing with radiology and reviewing chart to determine she does not need surgery as of now. 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 Hysterectomy (not due to cancer) - Partial  Diagnostic Studies History Ventura Sellers, [place]; 07/07/2014 8:37 AM) Colonoscopy 1-5 years ago Mammogram within last year Pap Smear 1-5 years ago  Allergies Ventura Sellers, CMA; 07/07/2014 8:37 AM) No Known Drug Allergies04/25/2016  Medication History Ventura Sellers, CMA; 07/07/2014 8:37 AM) Esomeprazole Magnesium (40MG  Packet, Oral) Active. Multiple Vitamin (Oral daily) Active. Fish Oil Active.  Social History Ventura Sellers, [place]; 07/07/2014 8:37 AM) Caffeine use Carbonated beverages, Coffee, Tea. No alcohol use No drug use Tobacco use Never smoker.  Family History Ventura Sellers, [place]; 07/07/2014 8:37 AM) Colon Cancer Mother.  Pregnancy / Birth History Ventura Sellers, [place]; 07/07/2014 8:37 AM) Age at menarche 19 years. Age of menopause >65 Gravida 1 Irregular periods Maternal age 51-25 Para 54  Vitals Sharyn Lull R. Brooks CMA;  07/07/2014 8:36 AM) 07/07/2014 8:36 AM Weight: 143.13 lb Height: 60in Body Surface Area: 1.66 m Body Mass Index: 27.95 kg/m BP: 134/86 (Sitting, Left Arm, Standard)    Physical Exam Rolm Bookbinder MD; 07/07/2014 9:51 AM) General Mental Status-Alert. Orientation-Oriented X3.  Chest and Lung Exam Chest and lung exam reveals -on auscultation, normal breath sounds, no adventitious sounds and normal vocal resonance.  Breast Nipples-No Discharge.   Cardiovascular Cardiovascular examination reveals -normal heart sounds, regular rate and rhythm with no murmurs.    Assessment & Plan Rolm Bookbinder MD; 07/07/2014 9:52 AM) RADIAL SCAR OF BREAST (611.89  N64.89) Story: Left breast seed guided excision discussed excising this area to ensure no atypia or cancer and upgrade rate. will do with seed guidance

## 2014-07-24 NOTE — Op Note (Signed)
Preoperative diagnosis: left breast csl on core biopsy Postoperative diagnosis: Same as above Procedure: Left breast radioactive seed guided excisional biopsy Surgeon: Dr. Serita Grammes Anesthesia: Gen. Specimens: left breast tissue marked with paint EBL: minimal Complications: None Drains: None Disposition to recovery in stable condition Sponge and needle count correct at completion  Indications: This a 31 yof who has a mm abnormality in follow up that is a csl and recommended for excision. We discussed all of her options and decided to proceed with a radioactive seed guided excision of this area. She had a radioactive seed placed prior to beginning.   Procedure: After informed consent was obtained the patient was taken to the operating room.She was given antibiotics. Sequential compression devices were on her legs. She was then placed under general anesthesia without complication. Her left breast was prepped and draped in the standard sterile surgical fashion. A surgical time out was then performed.  I located the radioactive seed in the breast with the neoprobe. . I infiltrated Marcaine.I made a curvilinear incision and  then removed the radioactive seed and the surrounding tissue. This was then passed off the table. Hemostasis was observed. The radioactive seed was confirmed to be removed by taking a mammogram. Mammogram of the specimen showed clip and seed removed. This was confirmed by radiology. I then closed with 2-0 Vicryl, 3-0 Vicryl, 4-0 Monocryl, glue and Steri-Strips. She tolerated this well. A breast binder was placed. She was transferred to recovery in stable condition.

## 2014-07-24 NOTE — Progress Notes (Signed)
Pt's daughter Nemiah Commander) here with patient today and both wish for her to interpret today. Daughter reports that previous interpreter (at breast center) was not interpreting correctly and it made for a poorer experience. Pt signed consent for daughter to be her interpreter

## 2014-07-24 NOTE — Discharge Instructions (Signed)
[facility] °Office Phone Number [phone] ° °POST OP INSTRUCTIONS ° °Always review your discharge instruction sheet given to you by the facility where your surgery was performed. ° °IF YOU HAVE DISABILITY OR FAMILY LEAVE FORMS, YOU MUST BRING THEM TO THE OFFICE FOR PROCESSING.  DO NOT GIVE THEM TO YOUR DOCTOR. ° °1. A prescription for pain medication may be given to you upon discharge.  Take your pain medication as prescribed, if needed.  If narcotic pain medicine is not needed, then you may take acetaminophen (Tylenol), naprosyn (Alleve) or ibuprofen (Advil) as needed. °2. Take your usually prescribed medications unless otherwise directed °3. If you need a refill on your pain medication, please contact your pharmacy.  They will contact our office to request authorization.  Prescriptions will not be filled after 5pm or on week-ends. °4. You should eat very light the first 24 hours after surgery, such as soup, crackers, pudding, etc.  Resume your normal diet the day after surgery. °5. Most patients will experience some swelling and bruising in the breast.  Ice packs and a good support bra will help.  Wear the breast binder provided or a sports bra for 72 hours day and night.  After that wear a sports bra during the day until you return to the office. Swelling and bruising can take several days to resolve.  °6. It is common to experience some constipation if taking pain medication after surgery.  Increasing fluid intake and taking a stool softener will usually help or prevent this problem from occurring.  A mild laxative (Milk of Magnesia or Miralax) should be taken according to package directions if there are no bowel movements after 48 hours. °7. Unless discharge instructions indicate otherwise, you may remove your bandages 48 hours after surgery and you may shower at that time.  You may have steri-strips (small skin tapes) in place directly over the incision.  These strips should be left on the  skin for 7-10 days and will come off on their own.  If your surgeon used skin glue on the incision, you may shower in 24 hours.  The glue will flake off over the next 2-3 weeks.  Any sutures or staples will be removed at the office during your follow-up visit. °8. ACTIVITIES:  You may resume regular daily activities (gradually increasing) beginning the next day.  Wearing a good support bra or sports bra minimizes pain and swelling.  You may have sexual intercourse when it is comfortable. °a. You may drive when you no longer are taking prescription pain medication, you can comfortably wear a seatbelt, and you can safely maneuver your car and apply brakes. °b. RETURN TO WORK:  ______________________________________________________________________________________ °9. You should see your doctor in the office for a follow-up appointment approximately two weeks after your surgery.  Your doctor’s nurse will typically make your follow-up appointment when she calls you with your pathology report.  Expect your pathology report 3-4 business days after your surgery.  You may call to check if you do not hear from us after three days. °10. OTHER INSTRUCTIONS: _______________________________________________________________________________________________ _____________________________________________________________________________________________________________________________________ °_____________________________________________________________________________________________________________________________________ °_____________________________________________________________________________________________________________________________________ ° °WHEN TO CALL DR WAKEFIELD: °1. Fever over 101.0 °2. Nausea and/or vomiting. °3. Extreme swelling or bruising. °4. Continued bleeding from incision. °5. Increased pain, redness, or drainage from the incision. ° °The clinic staff is available to answer your questions during regular  business hours.  Please don’t hesitate to call and ask to speak to one of the nurses for clinical concerns.  If   you have a medical emergency, go to the nearest emergency room or call 911.  A surgeon from [facility] is always on call at the hospital. ° °For further questions, please visit [email or web address] ° ° ° °Post Anesthesia Home Care Instructions ° °Activity: °Get plenty of rest for the remainder of the day. A responsible adult should stay with you for 24 hours following the procedure.  °For the next 24 hours, DO NOT: °-Drive a car °-Operate machinery °-Drink alcoholic beverages °-Take any medication unless instructed by your physician °-Make any legal decisions or sign important papers. ° °Meals: °Start with liquid foods such as gelatin or soup. Progress to regular foods as tolerated. Avoid greasy, spicy, heavy foods. If nausea and/or vomiting occur, drink only clear liquids until the nausea and/or vomiting subsides. Call your physician if vomiting continues. ° °Special Instructions/Symptoms: °Your throat may feel dry or sore from the anesthesia or the breathing tube placed in your throat during surgery. If this causes discomfort, gargle with warm salt water. The discomfort should disappear within 24 hours. ° °If you had a scopolamine patch placed behind your ear for the management of post- operative nausea and/or vomiting: ° °1. The medication in the patch is effective for 72 hours, after which it should be removed.  Wrap patch in a tissue and discard in the trash. Wash hands thoroughly with soap and water. °2. You may remove the patch earlier than 72 hours if you experience unpleasant side effects which may include dry mouth, dizziness or visual disturbances. °3. Avoid touching the patch. Wash your hands with soap and water after contact with the patch. °  ° °

## 2014-07-24 NOTE — Anesthesia Preprocedure Evaluation (Addendum)
Anesthesia Evaluation  Patient identified by MRN, date of birth, ID band Patient awake    Reviewed: Allergy & Precautions, NPO status , Patient's Chart, lab work & pertinent test results  Airway Mallampati: II  TM Distance: >3 FB Neck ROM: Full    Dental   Pulmonary neg pulmonary ROS,  breath sounds clear to auscultation        Cardiovascular negative cardio ROS  Rhythm:Regular Rate:Normal     Neuro/Psych    GI/Hepatic Neg liver ROS, GERD-  ,  Endo/Other  negative endocrine ROS  Renal/GU negative Renal ROS     Musculoskeletal   Abdominal   Peds  Hematology   Anesthesia Other Findings   Reproductive/Obstetrics                            Anesthesia Physical Anesthesia Plan  ASA: II  Anesthesia Plan: General   Post-op Pain Management:    Induction: Intravenous  Airway Management Planned: Oral ETT  Additional Equipment:   Intra-op Plan:   Post-operative Plan: Extubation in OR  Informed Consent: I have reviewed the patients History and Physical, chart, labs and discussed the procedure including the risks, benefits and alternatives for the proposed anesthesia with the patient or authorized representative who has indicated his/her understanding and acceptance.   Dental advisory given  Plan Discussed with: CRNA and Anesthesiologist  Anesthesia Plan Comments:         Anesthesia Quick Evaluation

## 2014-07-24 NOTE — Anesthesia Procedure Notes (Signed)
Procedure Name: LMA Insertion Date/Time: 07/24/2014 10:28 AM Performed by: Risa Auman D Pre-anesthesia Checklist: Patient identified, Emergency Drugs available, Suction available and Patient being monitored Patient Re-evaluated:Patient Re-evaluated prior to inductionOxygen Delivery Method: Circle System Utilized Preoxygenation: Pre-oxygenation with 100% oxygen Intubation Type: IV induction Ventilation: Mask ventilation without difficulty LMA: LMA inserted LMA Size: 4.0 Number of attempts: 1 Airway Equipment and Method: Bite block Placement Confirmation: positive ETCO2 Tube secured with: Tape Dental Injury: Teeth and Oropharynx as per pre-operative assessment

## 2014-07-25 ENCOUNTER — Encounter (HOSPITAL_BASED_OUTPATIENT_CLINIC_OR_DEPARTMENT_OTHER): Payer: Self-pay | Admitting: General Surgery

## 2015-11-29 ENCOUNTER — Other Ambulatory Visit: Payer: Self-pay | Admitting: Obstetrics and Gynecology

## 2015-11-30 LAB — CYTOLOGY - PAP

## 2016-04-27 IMAGING — MR MR BREAST BILATERAL W WO CONTRAST
19 of 26 series · 31 of 48 positions shown · IV contrast (13ml Multihance)
Comparison: Previous exam(s).

CLINICAL DATA: Patient had an abnormal screening mammogram dated
02/22/2014 showing an area of distortion in the left breast. This
was subsequently evaluated with tomosynthesis, which also shows an
area of distortion projecting in the superior left breast. Follow-up
ultrasound showed a vague area of shadowing. Patient underwent
breast MRI in an attempt to localize this area of distortion. If no
MRI correlate could be visualized, the recommendation was for
stereotactic breast biopsy of the area of distortion.

LABS:  None
EXAM:
BILATERAL BREAST MRI WITH AND WITHOUT CONTRAST
TECHNIQUE: Multiplanar, multisequence MR images of both breasts were obtained
prior to and following the intravenous administration of 13 ml of
MultiHance.

[Series 3: T2 · axial · 3.0mm · 0.94mm/px · 1 of 55 slices shown (1 of 2)]
[im 1/55]
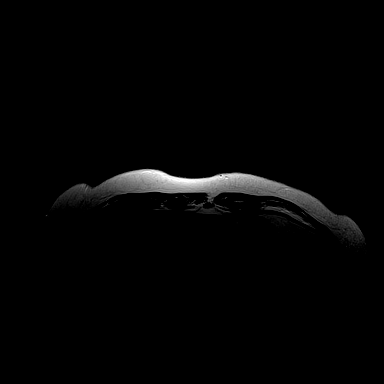

[Series 6: t2_tirm_tra ipat (a-p) · axial · 3.0mm · 0.70mm/px · 1 of 55 slices shown (1 of 2)]
[im 1/55]
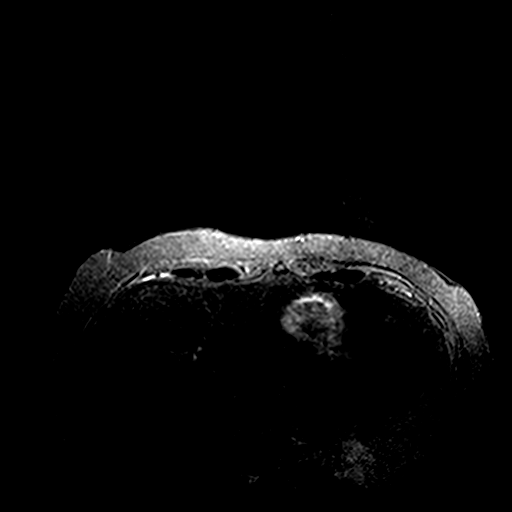

[Series 7: fl3d pre-cm no · axial · non-contrast · 1.2mm · 0.94mm/px · z∈[-108,+63]mm · 2 of 144 slices shown (1 of 2)]
[im 1/144]
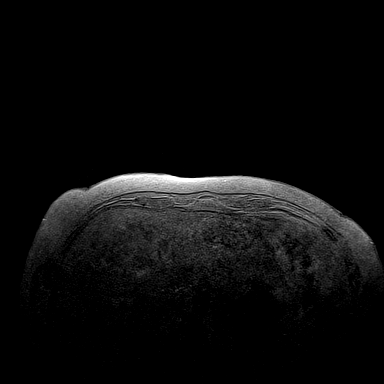
[im 144/144]
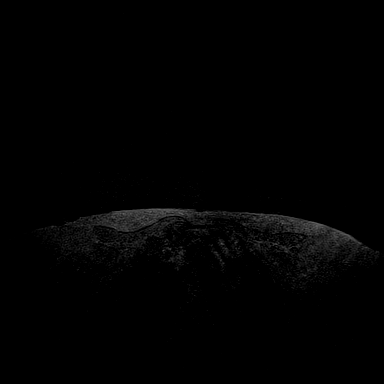

[Series 8: fl3d pre-cm · axial · non-contrast · 1.2mm · 0.94mm/px · z∈[-108,+63]mm · 2 of 144 slices shown (1 of 2)]
[im 1/144]
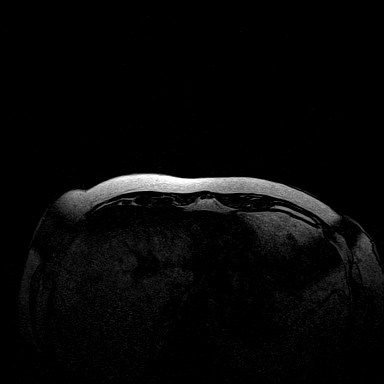
[im 144/144]
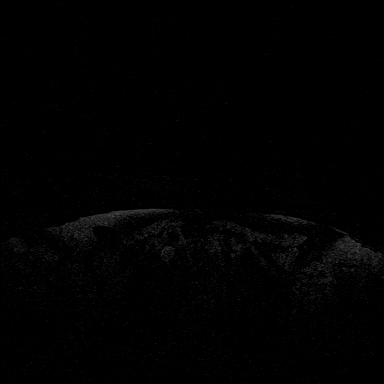

[Series 9: fl3d post-cm 20 · axial · 1.2mm · 0.94mm/px · z∈[-108,+63]mm · 2 of 144 slices shown (1 of 5)]
[im 1/144]
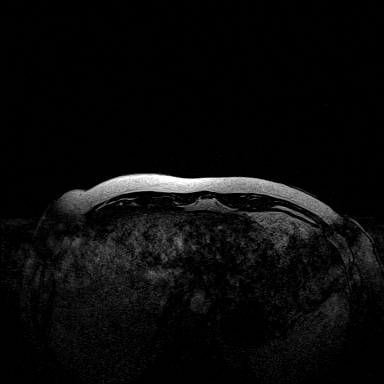
[im 144/144]
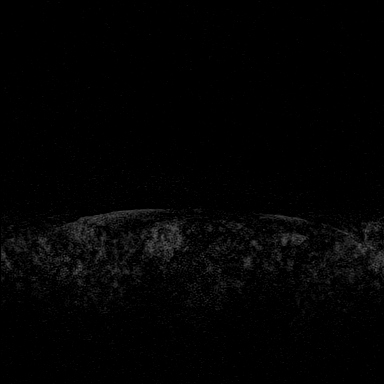

[Series 10: fl3d post-cm 20 · axial · 1.2mm · 0.94mm/px · z∈[-108,+63]mm · 2 of 144 slices shown (2 of 5)]
[im 1/144]
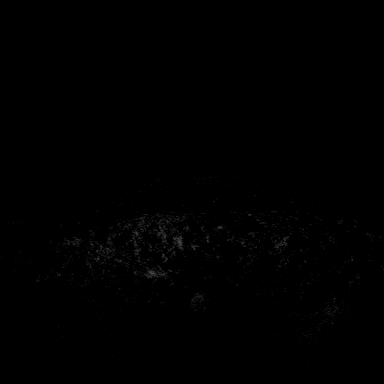
[im 144/144]
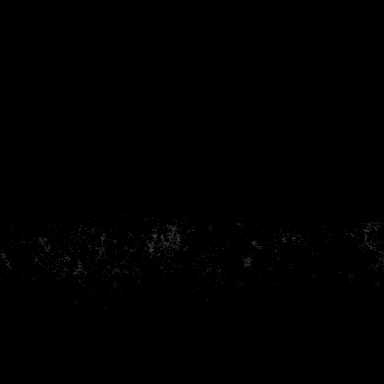

[Series 11: fl3d post-cm 20 · axial · 172.8mm · 0.94mm/px · 1 of 1 slices shown (3 of 5)]
[im 1/1]
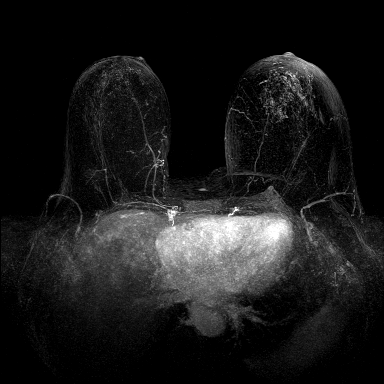

[Series 12: fl3d post-cm 3min · axial · 1.2mm · 0.94mm/px · z∈[-108,+63]mm · 2 of 144 slices shown]
[im 1/144]
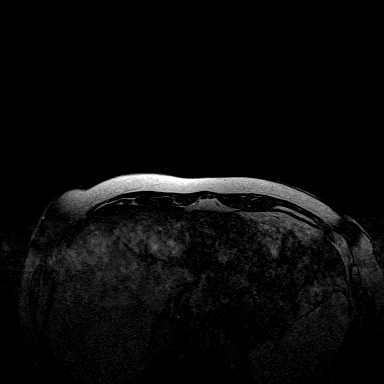
[im 144/144]
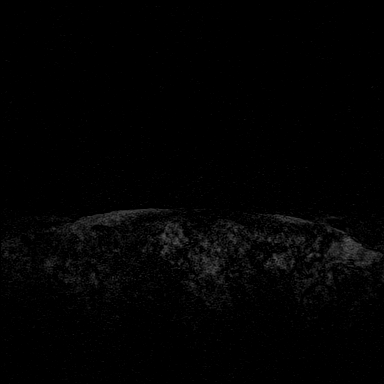

[Series 13: fl3d post-cm 3min_sub · axial · 1.2mm · 0.94mm/px · z∈[-108,+63]mm · 2 of 144 slices shown]
[im 1/144]
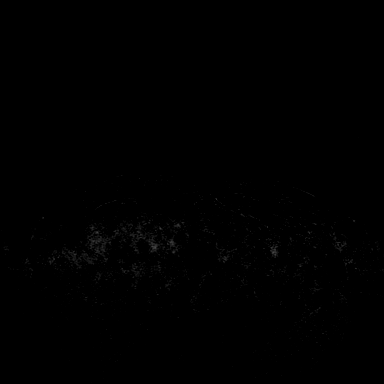
[im 144/144]
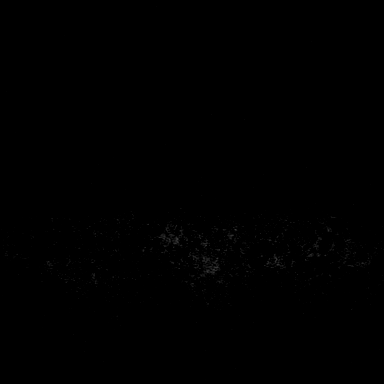

[Series 14: fl3d post-cm 3min_sub_mip_tra · axial · 172.8mm · 0.94mm/px · 1 of 1 slices shown]
[im 1/1]
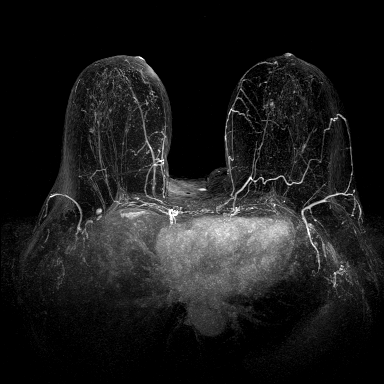

[Series 15: fl3d post-cm 5min · axial · 1.2mm · 0.94mm/px · z∈[-108,+63]mm · 2 of 144 slices shown]
[im 1/144]
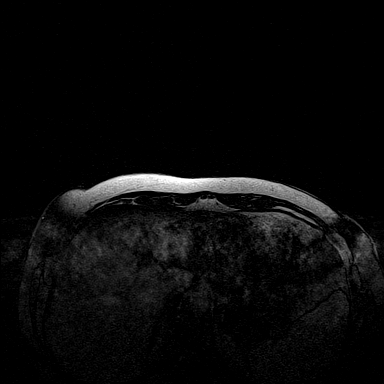
[im 144/144]
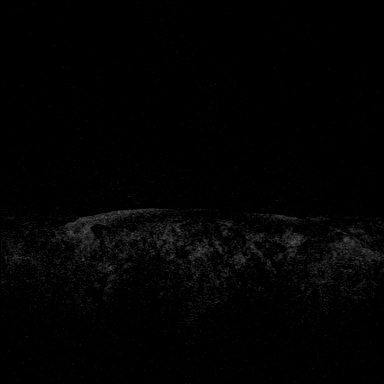

[Series 16: fl3d post-cm 5min_sub · axial · 1.2mm · 0.94mm/px · z∈[-108,+63]mm · 2 of 144 slices shown]
[im 1/144]
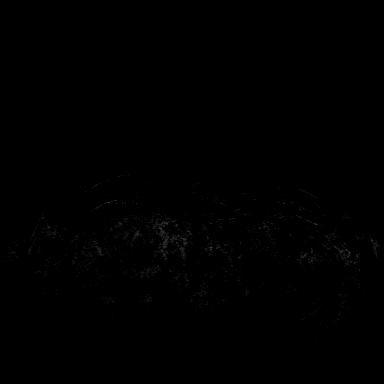
[im 144/144]
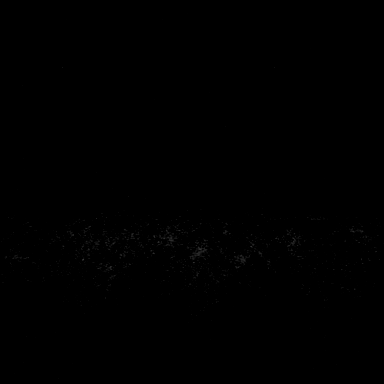

[Series 17: fl3d post-cm 5min_sub_mip_tra · axial · 172.8mm · 0.94mm/px · 1 of 1 slices shown]
[im 1/1]
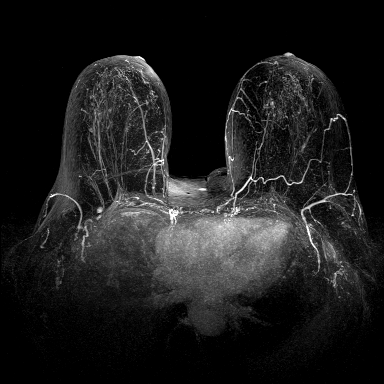

[Series 19: T2 · axial · 3.0mm · 0.94mm/px · 1 of 55 slices shown (2 of 2)]
[im 1/55]
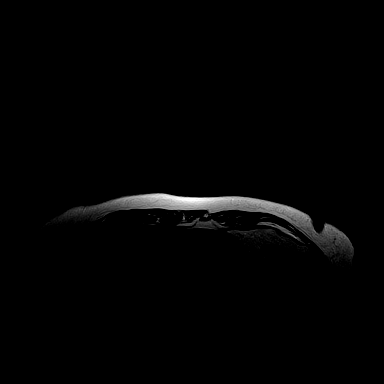

[Series 20: t2_tirm_tra ipat (a-p) · axial · 3.0mm · 0.70mm/px · 1 of 55 slices shown (2 of 2)]
[im 1/55]
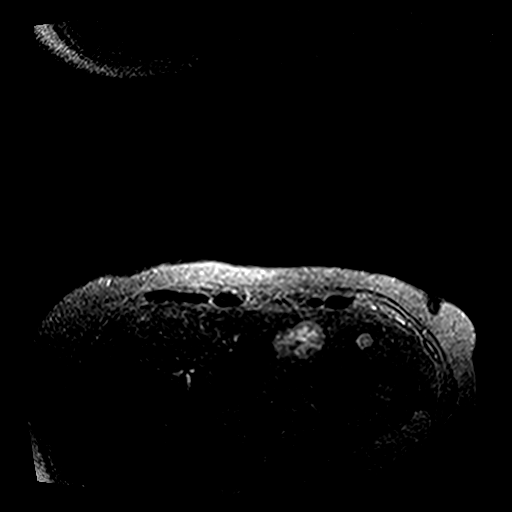

[Series 21: fl3d pre-cm no · axial · non-contrast · 1.2mm · 0.94mm/px · z∈[-97,+75]mm · 2 of 144 slices shown (2 of 2)]
[im 1/144]
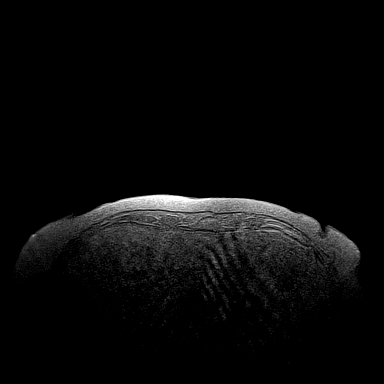
[im 144/144]
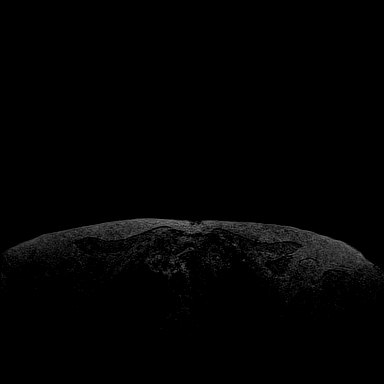

[Series 22: fl3d pre-cm · axial · non-contrast · 1.2mm · 0.94mm/px · z∈[-97,+75]mm · 2 of 144 slices shown (2 of 2)]
[im 1/144]
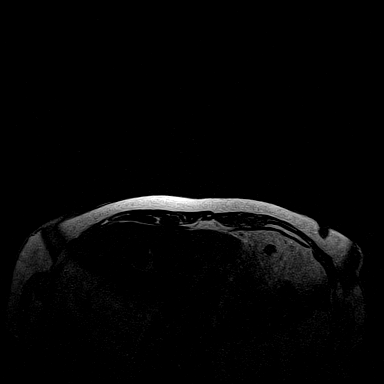
[im 144/144]
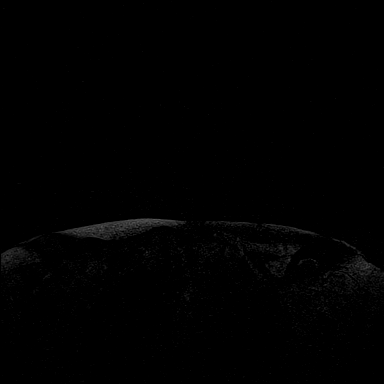

[Series 23: fl3d post-cm 20 · axial · 1.2mm · 0.94mm/px · z∈[-97,+75]mm · 3 of 144 slices shown (4 of 5)]
[im 1/144]
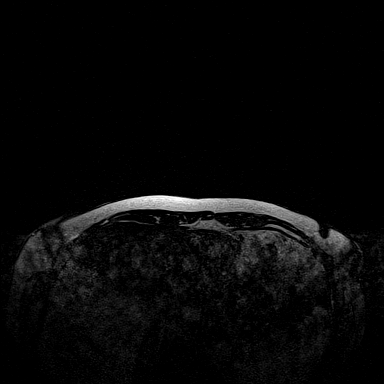
[im 72/144]
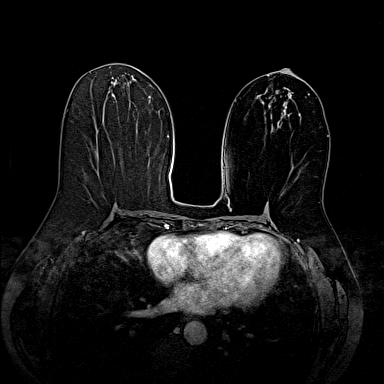
[im 144/144]
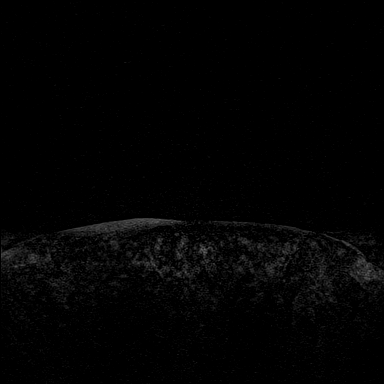

[Series 24: fl3d post-cm 20 · axial · 1.2mm · 0.94mm/px · 1 of 144 slices shown (5 of 5)]
[im 1/144]
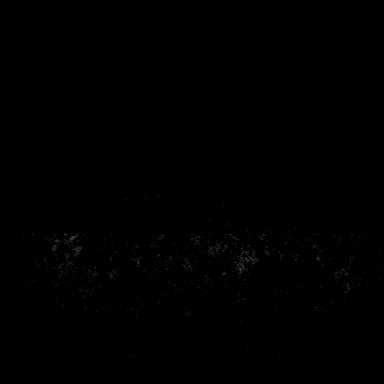

[31 of 48 positions shown; findings below may reference images not displayed]

THREE-DIMENSIONAL MR IMAGE RENDERING ON INDEPENDENT WORKSTATION:

Three-dimensional MR images were rendered by post-processing of the
original MR data on an independent workstation. The
three-dimensional MR images were interpreted, and findings are
reported in the following complete MRI report for this study. Three
dimensional images were evaluated at the independent [employer]
workstation
FINDINGS: Breast composition: b. Scattered fibroglandular tissue.

Background parenchymal enhancement: Mild

Right breast: No mass or abnormal enhancement.

Left breast: Small enhancing focus measuring 4 mm in diameter lies
in the right breast centrally, just medial to and above the midline
in the upper inner quadrant, between the anterior middle [DATE] in
depth. Margins are mildly irregular. Enhancement characteristics are
persistent/plateau. This lesion corresponds in location to the area
of distortion seen mammographically.

There are no other masses or areas of abnormal left breast
enhancement.

Lymph nodes: No abnormal appearing lymph nodes.

Ancillary findings:  None.
IMPRESSION: 4 mm enhancing focus in the upper inner aspect of the left breast,
corresponding in location to the area of architectural distortion
noted mammographically. As reported previously, this lesion may be
difficult to target on stereotactic breast biopsy. MRI guided biopsy
is recommended.

RECOMMENDATION:
MRI guided breast biopsy of the small enhancing, 4 mm, left breast
focus, which corresponds in location to the mammographic area of
architectural distortion.

BI-RADS CATEGORY  4: Suspicious.

## 2018-06-28 DIAGNOSIS — L989 Disorder of the skin and subcutaneous tissue, unspecified: Secondary | ICD-10-CM | POA: Diagnosis not present

## 2018-07-23 DIAGNOSIS — L720 Epidermal cyst: Secondary | ICD-10-CM | POA: Diagnosis not present

## 2018-07-23 DIAGNOSIS — Z85828 Personal history of other malignant neoplasm of skin: Secondary | ICD-10-CM | POA: Diagnosis not present

## 2018-07-23 DIAGNOSIS — D2239 Melanocytic nevi of other parts of face: Secondary | ICD-10-CM | POA: Diagnosis not present

## 2018-07-23 DIAGNOSIS — L7211 Pilar cyst: Secondary | ICD-10-CM | POA: Diagnosis not present

## 2018-07-23 DIAGNOSIS — D485 Neoplasm of uncertain behavior of skin: Secondary | ICD-10-CM | POA: Diagnosis not present

## 2018-07-23 DIAGNOSIS — D224 Melanocytic nevi of scalp and neck: Secondary | ICD-10-CM | POA: Diagnosis not present

## 2018-10-26 DIAGNOSIS — Z23 Encounter for immunization: Secondary | ICD-10-CM | POA: Diagnosis not present

## 2018-12-20 DIAGNOSIS — Z23 Encounter for immunization: Secondary | ICD-10-CM | POA: Diagnosis not present

## 2018-12-20 DIAGNOSIS — Z136 Encounter for screening for cardiovascular disorders: Secondary | ICD-10-CM | POA: Diagnosis not present

## 2018-12-20 DIAGNOSIS — E559 Vitamin D deficiency, unspecified: Secondary | ICD-10-CM | POA: Diagnosis not present

## 2018-12-20 DIAGNOSIS — Z0000 Encounter for general adult medical examination without abnormal findings: Secondary | ICD-10-CM | POA: Diagnosis not present

## 2018-12-22 ENCOUNTER — Other Ambulatory Visit: Payer: Self-pay | Admitting: Nurse Practitioner

## 2018-12-22 DIAGNOSIS — Z1231 Encounter for screening mammogram for malignant neoplasm of breast: Secondary | ICD-10-CM

## 2018-12-22 DIAGNOSIS — R5381 Other malaise: Secondary | ICD-10-CM

## 2019-01-06 ENCOUNTER — Other Ambulatory Visit: Payer: Self-pay | Admitting: Nurse Practitioner

## 2019-01-06 DIAGNOSIS — E2839 Other primary ovarian failure: Secondary | ICD-10-CM

## 2020-08-28 ENCOUNTER — Other Ambulatory Visit: Payer: Self-pay | Admitting: Nurse Practitioner

## 2020-09-10 ENCOUNTER — Other Ambulatory Visit: Payer: Self-pay | Admitting: Nurse Practitioner

## 2020-09-10 DIAGNOSIS — Z1231 Encounter for screening mammogram for malignant neoplasm of breast: Secondary | ICD-10-CM

## 2020-09-13 ENCOUNTER — Inpatient Hospital Stay: Admission: RE | Admit: 2020-09-13 | Payer: BLUE CROSS/BLUE SHIELD | Source: Ambulatory Visit
